# Patient Record
Sex: Male | Born: 1970 | Race: White | Hispanic: No | Marital: Married | Smoking: Never smoker
Health system: Southern US, Community
[De-identification: ages and names within clinical notes are randomized; demographics above are authoritative.]

---

## 2013-02-04 ENCOUNTER — Ambulatory Visit: Payer: Self-pay | Admitting: Emergency Medicine

## 2013-02-04 LAB — RAPID INFLUENZA A&B ANTIGENS

## 2014-12-20 ENCOUNTER — Ambulatory Visit: Payer: Self-pay | Admitting: Emergency Medicine

## 2015-08-13 ENCOUNTER — Ambulatory Visit
Admission: EM | Admit: 2015-08-13 | Discharge: 2015-08-13 | Disposition: A | Discharge status: Home / Self Care | Payer: 59 | Attending: Family Medicine | Admitting: Family Medicine

## 2015-08-13 ENCOUNTER — Ambulatory Visit: Payer: 59

## 2015-08-13 ENCOUNTER — Encounter: Payer: Self-pay | Admitting: Emergency Medicine

## 2015-08-13 DIAGNOSIS — S60512A Abrasion of left hand, initial encounter: Secondary | ICD-10-CM | POA: Diagnosis not present

## 2015-08-13 DIAGNOSIS — S32000A Wedge compression fracture of unspecified lumbar vertebra, initial encounter for closed fracture: Secondary | ICD-10-CM

## 2015-08-13 DIAGNOSIS — M5136 Other intervertebral disc degeneration, lumbar region: Secondary | ICD-10-CM

## 2015-08-13 DIAGNOSIS — S30810A Abrasion of lower back and pelvis, initial encounter: Secondary | ICD-10-CM

## 2015-08-13 DIAGNOSIS — M62830 Muscle spasm of back: Secondary | ICD-10-CM

## 2015-08-13 DIAGNOSIS — M62838 Other muscle spasm: Secondary | ICD-10-CM

## 2015-08-13 DIAGNOSIS — M5030 Other cervical disc degeneration, unspecified cervical region: Secondary | ICD-10-CM

## 2015-08-13 MED ORDER — IBUPROFEN 800 MG PO TABS: 800.0000 mg | ORAL_TABLET | Freq: Three times a day (TID) | ORAL | Status: AC | PRN

## 2015-08-13 MED ORDER — CYCLOBENZAPRINE HCL 5 MG PO TABS: 5.0000 mg | ORAL_TABLET | Freq: Three times a day (TID) | ORAL | Status: AC | PRN
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
## 2015-08-13 NOTE — Discharge Instructions (Signed)
Abrasion °An abrasion is a cut or scrape of the skin. Abrasions do not extend through all layers of the skin and most heal within 10 days. It is important to care for your abrasion properly to prevent infection. °CAUSES  °Most abrasions are caused by falling on, or gliding across, the ground or other surface. When your skin rubs on something, the outer and inner layer of skin rubs off, causing an abrasion. °DIAGNOSIS  °Your caregiver will be able to diagnose an abrasion during a physical exam.  °TREATMENT  °Your treatment depends on how large and deep the abrasion is. Generally, your abrasion will be cleaned with water and a mild soap to remove any dirt or debris. An antibiotic ointment may be put over the abrasion to prevent an infection. A bandage (dressing) may be wrapped around the abrasion to keep it from getting dirty.  °You may need a tetanus shot if: °· You cannot remember when you had your last tetanus shot. °· You have never had a tetanus shot. °· The injury broke your skin. °If you get a tetanus shot, your arm may swell, get red, and feel warm to the touch. This is common and not a problem. If you need a tetanus shot and you choose not to have one, there is a rare chance of getting tetanus. Sickness from tetanus can be serious.  °HOME CARE INSTRUCTIONS  °· If a dressing was applied, change it at least once a day or as directed by your caregiver. If the bandage sticks, soak it off with warm water.   °· Wash the area with water and a mild soap to remove all the ointment 2 times a day. Rinse off the soap and pat the area dry with a clean towel.   °· Reapply any ointment as directed by your caregiver. This will help prevent infection and keep the bandage from sticking. Use gauze over the wound and under the dressing to help keep the bandage from sticking.   °· Change your dressing right away if it becomes wet or dirty.   °· Only take over-the-counter or prescription medicines for pain, discomfort, or fever as  directed by your caregiver.   °· Follow up with your caregiver within 24-48 hours for a wound check, or as directed. If you were not given a wound-check appointment, look closely at your abrasion for redness, swelling, or pus. These are signs of infection. °SEEK IMMEDIATE MEDICAL CARE IF:  °· You have increasing pain in the wound.   °· You have redness, swelling, or tenderness around the wound.   °· You have pus coming from the wound.   °· You have a fever or persistent symptoms for more than 2-3 days. °· You have a fever and your symptoms suddenly get worse. °· You have a bad smell coming from the wound or dressing.   °MAKE SURE YOU:  °· Understand these instructions. °· Will watch your condition. °· Will get help right away if you are not doing well or get worse. °Document Released: 08/17/2005 Document Revised: 10/24/2012 Document Reviewed: 10/11/2011 °[employer]® Patient Information ©2015 [employer]. This information is not intended to replace advice given to you by your health care provider. Make sure you discuss any questions you have with your health care provider. °Contusion °A contusion is a deep bruise. Contusions are the result of an injury that caused bleeding under the skin. The contusion may turn blue, purple, or yellow. Minor injuries will give you a painless contusion, but more severe contusions may stay painful and swollen for   a few weeks.  CAUSES  A contusion is usually caused by a blow, trauma, or direct force to an area of the body. SYMPTOMS   Swelling and redness of the injured area.  Bruising of the injured area.  Tenderness and soreness of the injured area.  Pain. DIAGNOSIS  The diagnosis can be made by taking a history and physical exam. An X-ray, CT scan, or MRI may be needed to determine if there were any associated injuries, such as fractures. TREATMENT  Specific treatment will depend on what area of the body was injured. In general, the best treatment for a contusion is  resting, icing, elevating, and applying cold compresses to the injured area. Over-the-counter medicines may also be recommended for pain control. Ask your caregiver what the best treatment is for your contusion. HOME CARE INSTRUCTIONS   Put ice on the injured area.  Put ice in a plastic bag.  Place a towel between your skin and the bag.  Leave the ice on for 15-20 minutes, 3-4 times a day, or as directed by your health care provider.  Only take over-the-counter or prescription medicines for pain, discomfort, or fever as directed by your caregiver. Your caregiver may recommend avoiding anti-inflammatory medicines (aspirin, ibuprofen, and naproxen) for 48 hours because these medicines may increase bruising.  Rest the injured area.  If possible, elevate the injured area to reduce swelling. SEEK IMMEDIATE MEDICAL CARE IF:   You have increased bruising or swelling.  You have pain that is getting worse.  Your swelling or pain is not relieved with medicines. MAKE SURE YOU:   Understand these instructions.  Will watch your condition.  Will get help right away if you are not doing well or get worse. Document Released: 08/17/2005 Document Revised: 11/12/2013 Document Reviewed: 09/12/2011 [facility] Patient Information 2015 [place], [place]. This information is not intended to replace advice given to you by your health care provider. Make sure you discuss any questions you have with your health care provider. Muscle Cramps and Spasms Muscle cramps and spasms occur when a muscle or muscles tighten and you have no control over this tightening (involuntary muscle contraction). They are a common problem and can develop in any muscle. The most common place is in the calf muscles of the leg. Both muscle cramps and muscle spasms are involuntary muscle contractions, but they also have differences:   Muscle cramps are sporadic and painful. They may last a few seconds to a quarter of an hour. Muscle  cramps are often more forceful and last longer than muscle spasms.  Muscle spasms may or may not be painful. They may also last just a few seconds or much longer. CAUSES  It is uncommon for cramps or spasms to be due to a serious underlying problem. In many cases, the cause of cramps or spasms is unknown. Some common causes are:   Overexertion.   Overuse from repetitive motions (doing the same thing over and over).   Remaining in a certain position for a long period of time.   Improper preparation, form, or technique while performing a sport or activity.   Dehydration.   Injury.   Side effects of some medicines.   Abnormally low levels of the salts and ions in your blood (electrolytes), especially potassium and calcium. This could happen if you are taking water pills (diuretics) or you are pregnant.  Some underlying medical problems can make it more likely to develop cramps or spasms. These include, but are not limited to:  Diabetes.   Parkinson disease.   Hormone disorders, such as thyroid problems.   Alcohol abuse.   Diseases specific to muscles, joints, and bones.   Blood vessel disease where not enough blood is getting to the muscles.  HOME CARE INSTRUCTIONS   Stay well hydrated. Drink enough water and fluids to keep your urine clear or pale yellow.  It may be helpful to massage, stretch, and relax the affected muscle.  For tight or tense muscles, use a warm towel, heating pad, or hot shower water directed to the affected area.  If you are sore or have pain after a cramp or spasm, applying ice to the affected area may relieve discomfort.  Put ice in a plastic bag.  Place a towel between your skin and the bag.  Leave the ice on for 15-20 minutes, 03-04 times a day.  Medicines used to treat a known cause of cramps or spasms may help reduce their frequency or severity. Only take over-the-counter or prescription medicines as directed by your  caregiver. SEEK MEDICAL CARE IF:  Your cramps or spasms get more severe, more frequent, or do not improve over time.  MAKE SURE YOU:   Understand these instructions.  Will watch your condition.  Will get help right away if you are not doing well or get worse. Document Released: 04/29/2002 Document Revised: 03/04/2013 Document Reviewed: 10/24/2012 [facility] Patient Information 2015 [place], [place]. This information is not intended to replace advice given to you by your health care provider. Make sure you discuss any questions you have with your health care provider. Back, Compression Fracture A compression fracture happens when a force is put upon the length of your spine. Slipping and falling on your bottom are examples of such a force. When this happens, sometimes the force is great enough to compress the building blocks (vertebral bodies) of your spine. Although this causes a lot of pain, this can usually be treated at home, unless your caregiver feels hospitalization is needed for pain control. Your backbone (spinal column) is made up of 24 main vertebral bodies in addition to the sacrum and coccyx (see illustration). These are held together by tough fibrous tissues (ligaments) and by support of your muscles. Nerve roots pass through the openings between the vertebrae. A sudden wrenching move, injury, or a fall may cause a compression fracture of one of the vertebral bodies. This may result in back pain or spread of pain into the belly (abdomen), the buttocks, and down the leg into the foot. Pain may also be created by muscle spasm alone. Large studies have been undertaken to determine the best possible course of action to help your back following injury and also to prevent future problems. The recommendations are as follows. FOLLOWING A COMPRESSION FRACTURE: Do the following only if advised by your caregiver.   If a back brace has been suggested or provided, wear it as directed.  Do not stop  wearing the back brace unless instructed by your caregiver.  When allowed to return to regular activities, avoid a sedentary lifestyle. Actively exercise. Sporadic weekend binges of tennis, racquetball, or waterskiing may actually aggravate or create problems, especially if you are not in condition for that activity.  Avoid sports requiring sudden body movements until you are in condition for them. Swimming and walking are safer activities.  Maintain good posture.  Avoid obesity.  If not already done, you should have a DEXA scan. Based on the results, be treated for osteoporosis. FOLLOWING ACUTE (SUDDEN) INJURY:  Only take over-the-counter or prescription medicines for pain, discomfort, or fever as directed by your caregiver.  Use bed rest for only the most extreme acute episode. Prolonged bed rest may aggravate your condition. Ice used for acute conditions is effective. Use a large plastic bag filled with ice. Wrap it in a towel. This also provides excellent pain relief. This may be continuous. Or use it for 30 minutes every 2 hours during acute phase, then as needed. Heat for 30 minutes prior to activities is helpful.  As soon as the acute phase (the time when your back is too painful for you to do normal activities) is over, it is important to resume normal activities and work Arboriculturist. Back injuries can cause potentially marked changes in lifestyle. So it is important to attack these problems aggressively.  See your caregiver for continued problems. He or she can help or refer you for appropriate exercises, physical therapy, and work hardening if needed.  If you are given narcotic medications for your condition, for the next 24 hours do not:  Drive.  Operate machinery or power tools.  Sign legal documents.  Do not drink alcohol, or take sleeping pills or other medications that may interfere with treatment. If your caregiver has given you a follow-up appointment, it is very  important to keep that appointment. Not keeping the appointment could result in a chronic or permanent injury, pain, and disability. If there is any problem keeping the appointment, you must call back to this facility for assistance.  SEEK IMMEDIATE MEDICAL CARE IF:  You develop numbness, tingling, weakness, or problems with the use of your arms or legs.  You develop severe back pain not relieved with medications.  You have changes in bowel or bladder control.  You have increasing pain in any areas of the body. Document Released: 11/07/2005 Document Revised: 03/24/2014 Document Reviewed: 06/11/2008 [facility] Patient Information 2015 [place], [place]. This information is not intended to replace advice given to you by your health care provider. Make sure you discuss any questions you have with your health care provider. Degenerative Disk Disease Degenerative disk disease is a condition caused by the changes that occur in the cushions of the backbone (spinal disks) as you grow older. Spinal disks are soft and compressible disks located between the bones of the spine (vertebrae). They act like shock absorbers. Degenerative disk disease can affect the whole spine. However, the neck and lower back are most commonly affected. Many changes can occur in the spinal disks with aging, such as:  The spinal disks may dry and shrink.  Small tears may occur in the tough, outer covering of the disk (annulus).  The disk space may become smaller due to loss of water.  Abnormal growths in the bone (spurs) may occur. This can put pressure on the nerve roots exiting the spinal canal, causing pain.  The spinal canal may become narrowed. CAUSES  Degenerative disk disease is a condition caused by the changes that occur in the spinal disks with aging. The exact cause is not known, but there is a genetic basis for many patients. Degenerative changes can occur due to loss of fluid in the disk. This makes the disk thinner  and reduces the space between the backbones. Small cracks can develop in the outer layer of the disk. This can lead to the breakdown of the disk. You are more likely to get degenerative disk disease if you are overweight. Smoking cigarettes and doing heavy work such as Technical brewer  can also increase your risk of this condition. Degenerative changes can start after a sudden injury. Growth of bone spurs can compress the nerve roots and cause pain.  SYMPTOMS  The symptoms vary from person to person. Some people may have no pain, while others have severe pain. The pain may be so severe that it can limit your activities. The location of the pain depends on the part of your backbone that is affected. You will have neck or arm pain if a disk in the neck area is affected. You will have pain in your back, buttocks, or legs if a disk in the lower back is affected. The pain becomes worse while bending, reaching up, or with twisting movements. The pain may start gradually and then get worse as time passes. It may also start after a major or minor injury. You may feel numbness or tingling in the arms or legs.  DIAGNOSIS  Your caregiver will ask you about your symptoms and about activities or habits that may cause the pain. He or she may also ask about any injuries, diseases, or treatments you have had earlier. Your caregiver will examine you to check for the range of movement that is possible in the affected area, to check for strength in your extremities, and to check for sensation in the areas of the arms and legs supplied by different nerve roots. An X-ray of the spine may be taken. Your caregiver may suggest other imaging tests, such as magnetic resonance imaging (MRI), if needed.  TREATMENT  Treatment includes rest, modifying your activities, and applying ice and heat. Your caregiver may prescribe medicines to reduce your pain and may ask you to do some exercises to strengthen your back. In some cases, you may need  surgery. You and your caregiver will decide on the treatment that is best for you. HOME CARE INSTRUCTIONS   Follow proper lifting and walking techniques as advised by your caregiver.  Maintain good posture.  Exercise regularly as advised.  Perform relaxation exercises.  Change your sitting, standing, and sleeping habits as advised. Change positions frequently.  Lose weight as advised.  Stop smoking if you smoke.  Wear supportive footwear. SEEK MEDICAL CARE IF:  Your pain does not go away within 1 to 4 weeks. SEEK IMMEDIATE MEDICAL CARE IF:   Your pain is severe.  You notice weakness in your arms, hands, or legs.  You begin to lose control of your bladder or bowel movements. MAKE SURE YOU:   Understand these instructions.  Will watch your condition.  Will get help right away if you are not doing well or get worse. Document Released: 09/04/2007 Document Revised: 01/30/2012 Document Reviewed: 03/11/2014 [facility] Patient Information 2015 [place], [place]. This information is not intended to replace advice given to you by your health care provider. Make sure you discuss any questions you have with your health care provider.
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
## 2015-08-14 MED ORDER — MUPIROCIN 2 % EX OINT: 1.0000 "application " | TOPICAL_OINTMENT | Freq: Two times a day (BID) | CUTANEOUS | Status: AC

## 2015-08-14 MED ORDER — SULFAMETHOXAZOLE-TRIMETHOPRIM 800-160 MG PO TABS: 1.0000 | ORAL_TABLET | Freq: Two times a day (BID) | ORAL | Status: AC
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
